# Patient Record
Sex: Male | Born: 1965 | Race: White | Hispanic: No | Smoking: Former smoker
Health system: Southern US, Community
[De-identification: ages and names within clinical notes are randomized; demographics above are authoritative.]

## PROBLEM LIST (undated history)

## (undated) DIAGNOSIS — N200 Calculus of kidney: Secondary | ICD-10-CM

## (undated) DIAGNOSIS — K5900 Constipation, unspecified: Secondary | ICD-10-CM

## (undated) DIAGNOSIS — R109 Unspecified abdominal pain: Secondary | ICD-10-CM

## (undated) DIAGNOSIS — R7989 Other specified abnormal findings of blood chemistry: Secondary | ICD-10-CM

## (undated) DIAGNOSIS — K219 Gastro-esophageal reflux disease without esophagitis: Secondary | ICD-10-CM

## (undated) DIAGNOSIS — E785 Hyperlipidemia, unspecified: Secondary | ICD-10-CM

## (undated) DIAGNOSIS — N529 Male erectile dysfunction, unspecified: Secondary | ICD-10-CM

## (undated) DIAGNOSIS — F339 Major depressive disorder, recurrent, unspecified: Secondary | ICD-10-CM

## (undated) DIAGNOSIS — F909 Attention-deficit hyperactivity disorder, unspecified type: Secondary | ICD-10-CM

## (undated) DIAGNOSIS — F901 Attention-deficit hyperactivity disorder, predominantly hyperactive type: Secondary | ICD-10-CM

## (undated) DIAGNOSIS — F419 Anxiety disorder, unspecified: Secondary | ICD-10-CM

## (undated) HISTORY — PX: OTHER SURGICAL HISTORY: SHX169

## (undated) HISTORY — DX: Anxiety disorder, unspecified: F41.9

## (undated) HISTORY — DX: Attention-deficit hyperactivity disorder, unspecified type: F90.9

## (undated) HISTORY — DX: Calculus of kidney: N20.0

## (undated) HISTORY — DX: Attention-deficit hyperactivity disorder, predominantly hyperactive type: F90.1

## (undated) HISTORY — DX: Constipation, unspecified: K59.00

## (undated) HISTORY — PX: LITHOTRIPSY: SUR834

## (undated) HISTORY — DX: Unspecified abdominal pain: R10.9

## (undated) HISTORY — DX: Other specified abnormal findings of blood chemistry: R79.89

## (undated) HISTORY — DX: Gastro-esophageal reflux disease without esophagitis: K21.9

## (undated) HISTORY — DX: Major depressive disorder, recurrent, unspecified: F33.9

## (undated) HISTORY — DX: Hyperlipidemia, unspecified: E78.5

## (undated) HISTORY — DX: Male erectile dysfunction, unspecified: N52.9

---

## 2006-05-22 ENCOUNTER — Emergency Department (HOSPITAL_COMMUNITY): Admission: EM | Admit: 2006-05-22 | Discharge: 2006-05-22 | Payer: Self-pay | Admitting: Family Medicine

## 2008-06-05 HISTORY — PX: SHOULDER SURGERY: SHX246

## 2008-07-06 ENCOUNTER — Encounter: Admission: RE | Admit: 2008-07-06 | Discharge: 2008-07-06 | Payer: Self-pay | Admitting: Family Medicine

## 2008-07-21 ENCOUNTER — Encounter: Admission: RE | Admit: 2008-07-21 | Discharge: 2008-07-21 | Payer: Self-pay | Admitting: Family Medicine

## 2008-11-18 ENCOUNTER — Encounter: Admission: RE | Admit: 2008-11-18 | Discharge: 2008-11-18 | Payer: Self-pay | Admitting: Family Medicine

## 2009-06-05 HISTORY — PX: SHOULDER SURGERY: SHX246

## 2009-08-30 ENCOUNTER — Encounter: Admission: RE | Admit: 2009-08-30 | Discharge: 2009-08-30 | Payer: Self-pay | Admitting: Family Medicine

## 2009-09-13 ENCOUNTER — Ambulatory Visit (HOSPITAL_COMMUNITY): Admission: RE | Admit: 2009-09-13 | Discharge: 2009-09-13 | Payer: Self-pay | Admitting: Urology

## 2010-07-01 IMAGING — CT CT ABD-PELV W/O CM
2 of 4 series · 17 of 46 positions shown, 19 images · non-contrast
Comparison: 07/21/2008

CLINICAL DATA: Left flank pain, hematuria.

CT ABDOMEN AND PELVIS WITHOUT CONTRAST
TECHNIQUE: Multidetector CT imaging of the abdomen and pelvis was
performed following the standard protocol without intravenous
contrast.

[Series 2: wo · axial · 0.75mm/px · z∈[+70,+495]mm · 14 of 93 slices shown, 16 images]
[im 4/93  soft-tissue]
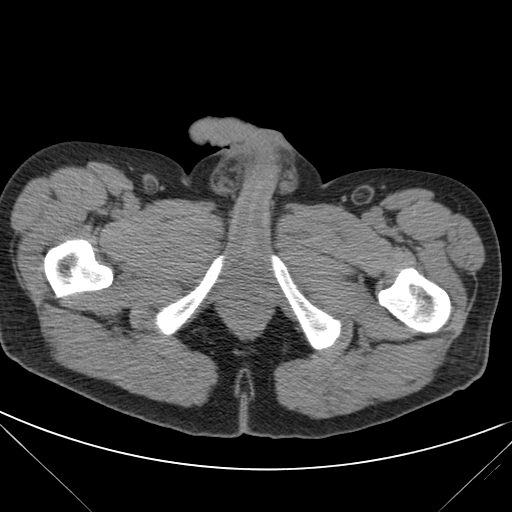
[im 4/93  bone]
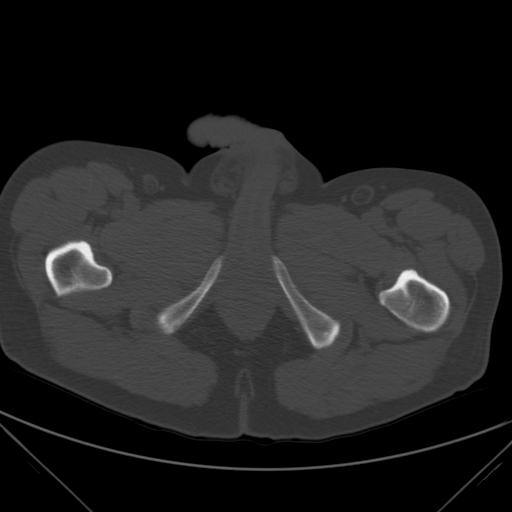
[im 12/93  soft-tissue]
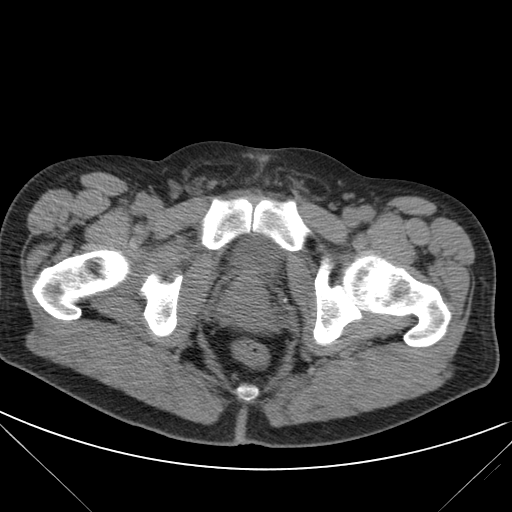
[im 20/93  soft-tissue]
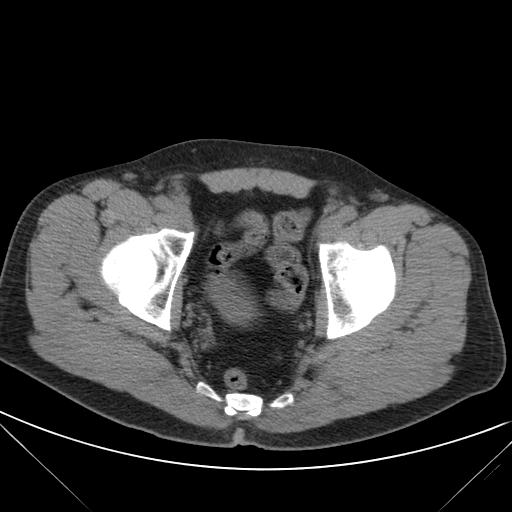
[im 24/93  soft-tissue]
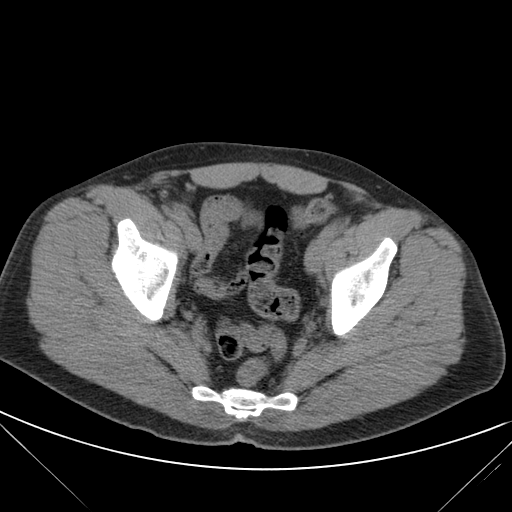
[im 31/93  soft-tissue]
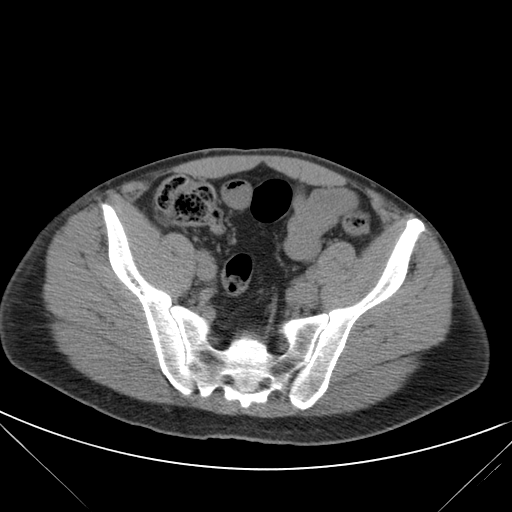
[im 39/93  soft-tissue]
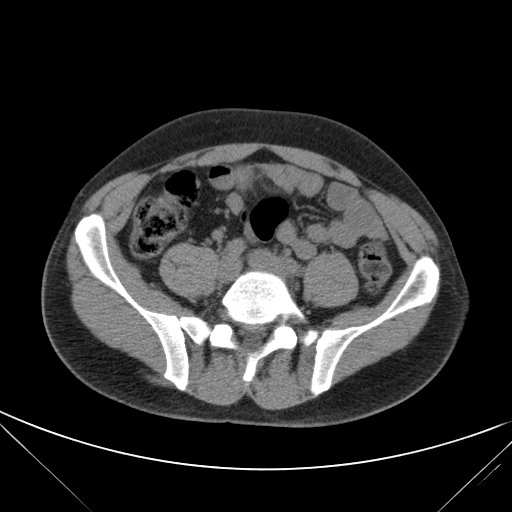
[im 43/93  soft-tissue]
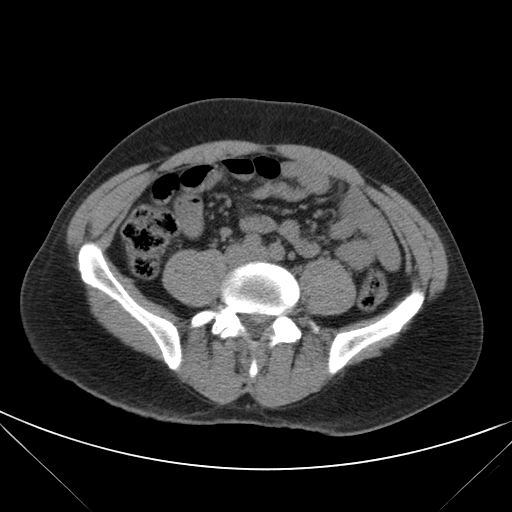
[im 50/93  soft-tissue]
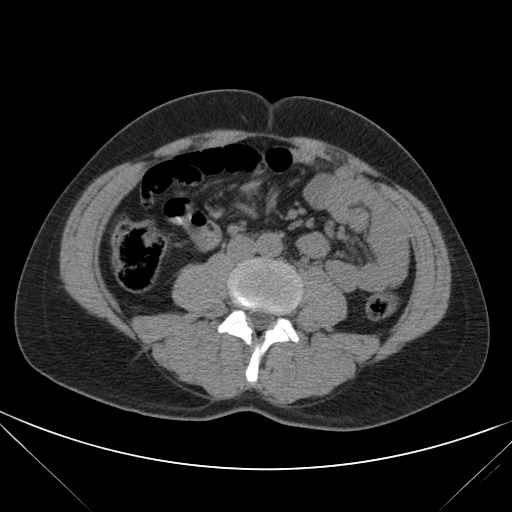
[im 54/93  soft-tissue]
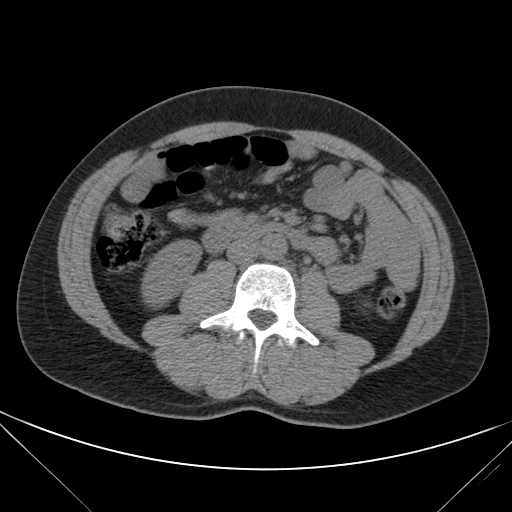
[im 54/93  bone]
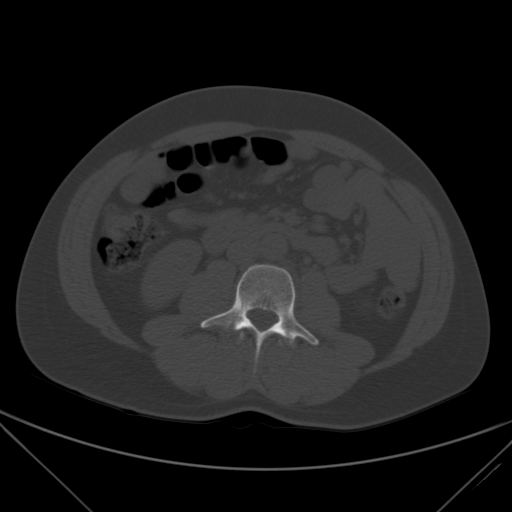
[im 62/93  soft-tissue]
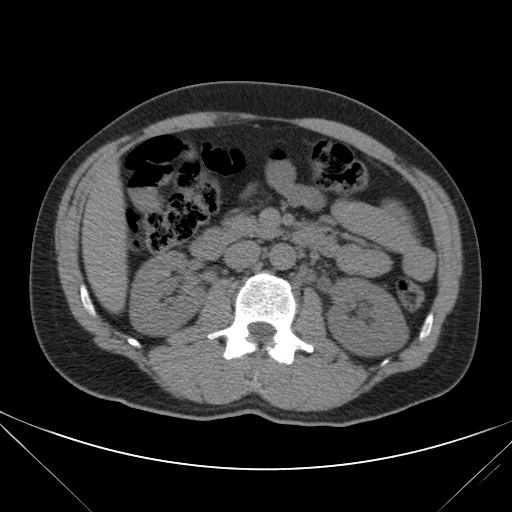
[im 70/93  soft-tissue]
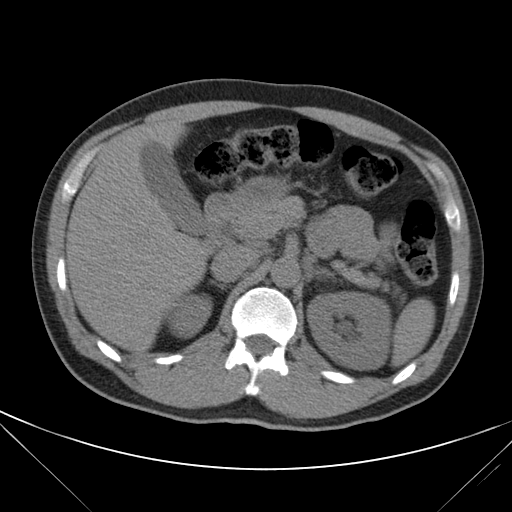
[im 73/93  soft-tissue]
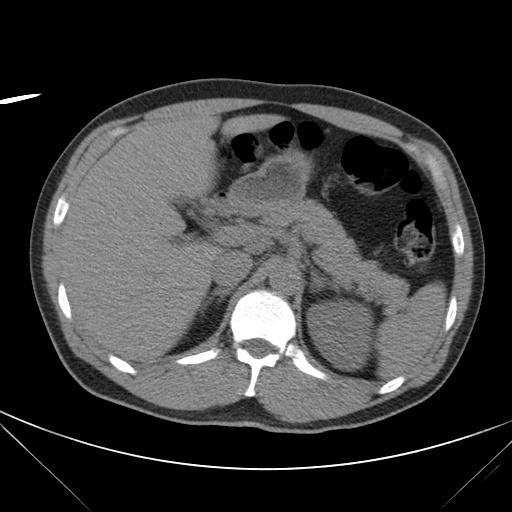
[im 81/93  soft-tissue]
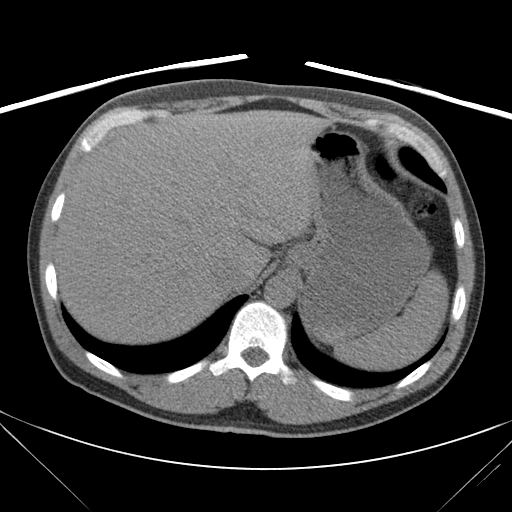
[im 89/93  soft-tissue]
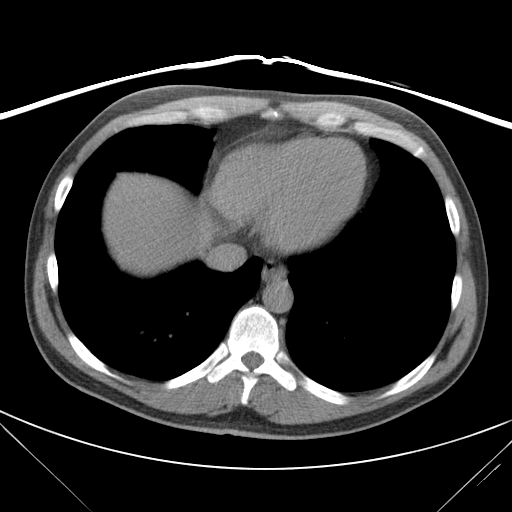

[cor · coronal · 0.90mm/px · 3 of 79 slices shown]
[im 27/79  soft-tissue]
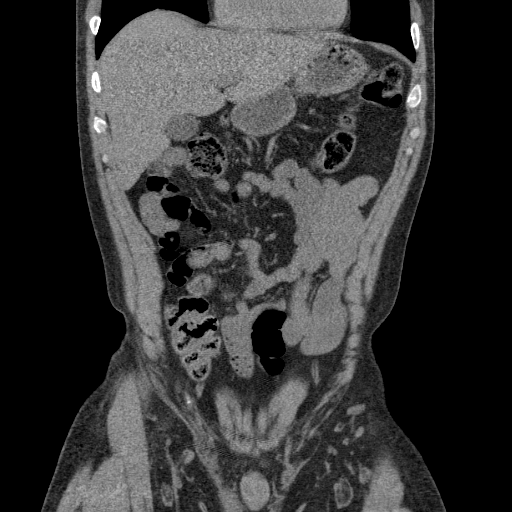
[im 35/79  soft-tissue]
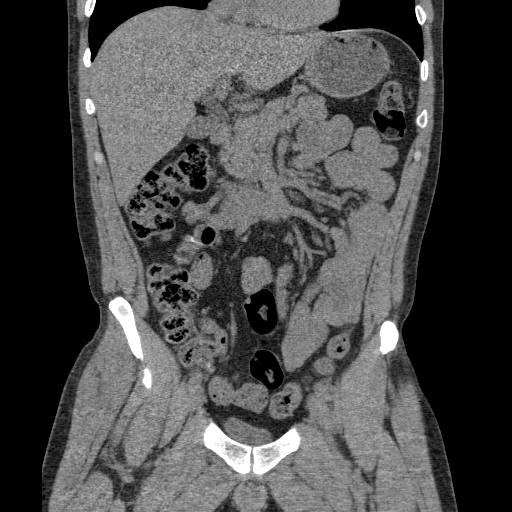
[im 44/79  soft-tissue]
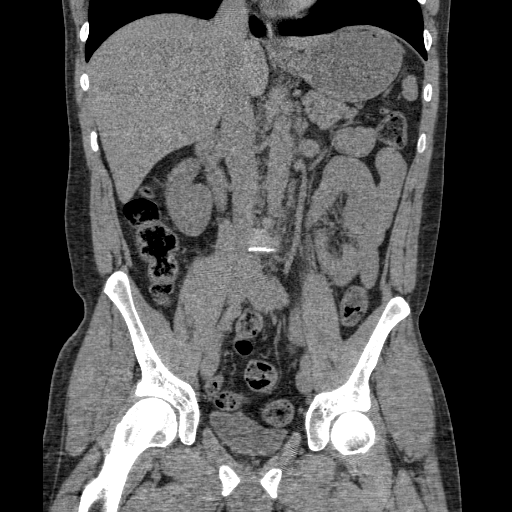

[17 of 46 positions shown; findings below may reference images not displayed]

FINDINGS: Lung bases are clear.  No effusions.  Heart is normal
size.

Abdomen:  6 mm left UPJ stone with mild left hydronephrosis.  Small
nonobstructing lower pole stone on the left.  No stones in the
right kidney or ureter.  No hydronephrosis on the right.

Liver, spleen, gallbladder, pancreas, adrenals have an unremarkable
unenhanced appearance.  Scattered descending colonic diverticula
without diverticulosis.  Small bowel unremarkable.  Aorta normal
caliber.

Pelvis:  No additional ureteral stones.  Bladder unremarkable.
Prostate and seminal vesicles grossly unremarkable.  Pelvic large
and small bowel normal.  Appendix is visualized and is normal.

Small bilateral inguinal hernias containing fat.

No acute bony abnormality.
IMPRESSION: 6 mm left UPJ stone with mild left hydronephrosis.  Small
nonobstructing left lower pole stone.

## 2011-06-08 ENCOUNTER — Ambulatory Visit: Payer: Worker's Compensation

## 2013-09-01 ENCOUNTER — Ambulatory Visit (INDEPENDENT_AMBULATORY_CARE_PROVIDER_SITE_OTHER): Payer: BC Managed Care – PPO | Admitting: Family Medicine

## 2013-09-01 VITALS — BP 136/98 | HR 74 | Temp 97.9°F | Resp 18 | Ht 70.0 in | Wt 203.2 lb

## 2013-09-01 DIAGNOSIS — H5710 Ocular pain, unspecified eye: Secondary | ICD-10-CM

## 2013-09-01 DIAGNOSIS — S058X9A Other injuries of unspecified eye and orbit, initial encounter: Secondary | ICD-10-CM

## 2013-09-01 DIAGNOSIS — H0289 Other specified disorders of eyelid: Secondary | ICD-10-CM

## 2013-09-01 DIAGNOSIS — S01102A Unspecified open wound of left eyelid and periocular area, initial encounter: Secondary | ICD-10-CM

## 2013-09-01 NOTE — Patient Instructions (Signed)
WOUND CARE Please return in 5 days to have your stitches/staples removed or sooner if you have concerns. Marland Kitchen. Keep area clean and dry for 24 hours. Do not remove bandage, if applied. . Continue daily cleansing with soap and water until stitches/staples are removed. . Do not apply any ointments or creams to the wound while stitches/staples are in place, as this may cause delayed healing. . Notify the office if you experience any of the following signs of infection: Swelling, redness, pus drainage, streaking, fever >101.0 F . Notify the office if you experience excessive bleeding that does not stop after 15-20 minutes of constant, firm pressure.

## 2013-09-01 NOTE — Progress Notes (Signed)
   Patient ID: Mindi JunkerRobert J Lewman MRN: [id number], DOB: 03/02/1966, 48 y.o. Date of Encounter: 09/01/2013, 9:36 PM   PROCEDURE NOTE: Verbal consent obtained.  Risks and benefits of the procedure were explained. Patient made an informed decision to proceed with the procedure. Sterile technique employed. Numbing: Anesthesia obtained with 1% plain lidocaine 1 cc for local anesthesia.   Cleansed with soap and water. Irrigated. Betadine prep per usual protocol.  Wound explored, no deep structures involved, no foreign bodies.   Wound repaired with # 5 simple interrupted sutures of 6-0 Ethilon.  Hemostasis obtained. Wound cleansed and dressed.  Wound care instructions including precautions covered with patient. Handout given.  Anticipate suture removal in 5 days.   Signed, Eula Listenyan Crystalann Korf, MHS, PA-C [facility] [place], [place] [id number] [phone] [facility] 09/01/2013 9:36 PM

## 2013-09-01 NOTE — Progress Notes (Signed)
Subjective: 48 year old man who was scratched on his left upper eyelid by his laboratory puppy. He had been playing with the dog and is wife push the dog away rather than having it landed on her since she is postop from her gallbladder. The dog cough hit him squarely in the left orbit, and he did not see it coming. He reached up and saw blood and came on in. It did not knock his contact out, and his vision is good.  TDap last year.  Objective:  vsual acuit good in both eyes as noted.  He has a 2.8 cm laceration along the top of the left upper eyelid perfectly along the crease line. The wound has about 3 mm of gaping, primarily in the center portion.  Assessment: Laceration left upper eyelid  Plan: Eula Listenyan Dunn, PA-C will suture.

## 2013-09-06 ENCOUNTER — Ambulatory Visit (INDEPENDENT_AMBULATORY_CARE_PROVIDER_SITE_OTHER): Payer: BC Managed Care – PPO | Admitting: Family Medicine

## 2013-09-06 VITALS — BP 120/74 | HR 85 | Temp 97.9°F | Resp 18 | Wt 204.0 lb

## 2013-09-06 DIAGNOSIS — S01109A Unspecified open wound of unspecified eyelid and periocular area, initial encounter: Secondary | ICD-10-CM

## 2013-09-06 DIAGNOSIS — Z4802 Encounter for removal of sutures: Secondary | ICD-10-CM

## 2013-09-06 DIAGNOSIS — S058X9A Other injuries of unspecified eye and orbit, initial encounter: Secondary | ICD-10-CM

## 2013-09-06 NOTE — Progress Notes (Signed)
Subjective: Patient here for sutures to be removed from his upper lid. It is doing well.  Objective: He has a moderate block on the left I. Sutures were removed little difficulty because they were so small, but the wound looks fine.  Assessment: Wound left upper eyelid  Plan: Return when necessary

## 2018-03-18 DIAGNOSIS — Z23 Encounter for immunization: Secondary | ICD-10-CM | POA: Diagnosis not present

## 2018-07-11 DIAGNOSIS — F419 Anxiety disorder, unspecified: Secondary | ICD-10-CM | POA: Diagnosis not present

## 2018-07-11 DIAGNOSIS — R000 Tachycardia, unspecified: Secondary | ICD-10-CM | POA: Diagnosis not present

## 2018-07-11 DIAGNOSIS — F901 Attention-deficit hyperactivity disorder, predominantly hyperactive type: Secondary | ICD-10-CM | POA: Diagnosis not present

## 2018-07-11 DIAGNOSIS — Z0000 Encounter for general adult medical examination without abnormal findings: Secondary | ICD-10-CM | POA: Diagnosis not present

## 2018-07-11 DIAGNOSIS — E785 Hyperlipidemia, unspecified: Secondary | ICD-10-CM | POA: Diagnosis not present

## 2018-07-16 DIAGNOSIS — R7309 Other abnormal glucose: Secondary | ICD-10-CM | POA: Diagnosis not present

## 2018-07-16 DIAGNOSIS — E785 Hyperlipidemia, unspecified: Secondary | ICD-10-CM | POA: Diagnosis not present

## 2018-07-16 DIAGNOSIS — R000 Tachycardia, unspecified: Secondary | ICD-10-CM | POA: Diagnosis not present

## 2018-07-16 DIAGNOSIS — Z125 Encounter for screening for malignant neoplasm of prostate: Secondary | ICD-10-CM | POA: Diagnosis not present

## 2018-07-23 NOTE — Progress Notes (Signed)
Cardiology Office Note   Date:  07/25/2018   ID:  Ethan Baird, Ethan Baird Jan 16, 1966, MRN [id number]  PCP:  Cari Caraway, MD  Cardiologist:   No primary care provider on file. Referring:  Cari Caraway, MD  Chief Complaint  Patient presents with  . Chest Pain      History of Present Illness: Ethan Baird is a 53 y.o. male who is referred by Cari Caraway, MD for evaluation of tachycardia and leg pain.  The patient has no past cardiac history.  Since December or so he has been having leg pain.  He has had some problems with his legs for some time with restless leg syndrome treated 10 years ago.  He does a lot of walking.  However, over the last 6 months he thinks he has had more problems with his legs with some discomfort at the end of the day.  He is not describing discomfort when he walks.  However, he gets thigh fatigue when he walks.  He says that since December this is been particularly severe.  He has had nights where he comes home and his legs are so fatigued that they actually ache.  His girlfriend says they are sore to touch.  While this is happening he might have his heart racing.  He has to let this slowly resolve over 2 to 3 hours.  He is not describing back discomfort.  He is not describing joint problems.  He is not have difficulty walking which again he has to do quite a bit.  He does describe chest discomfort.  He describes bilateral discomfort that is slightly axillary.  Seems to help to stretch.  He does not bring this on with activities.  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  He has pushed a lawnmower without bringing this on.  He is not describing any new shortness of breath, PND or orthopnea.  He said no new presyncope or syncope.   Past Medical History:  Diagnosis Date  . Abdominal pain   . ADHD   . Anxiety   . Attention-deficit disorder, predominantly hyperactive-impulsive type   . Constipation   . Dyslipidemia   . Erectile  dysfunction   . Gastroesophageal reflux disease without esophagitis   . Kidney stone   . Low testosterone in male   . Major depression, recurrent ([place])     Past Surgical History:  Procedure Laterality Date  . GRAPEY    . LITHOTRIPSY    . SHOULDER SURGERY Left 2010  . SHOULDER SURGERY Right 2011     Current Outpatient Medications  Medication Sig Dispense Refill  . ADDERALL XR 30 MG 24 hr capsule Take 30 mg by mouth daily.    . magnesium gluconate (MAGONATE) 500 MG tablet Take 500 mg by mouth daily.    Marland Kitchen omeprazole (PRILOSEC) 20 MG capsule Take 20 mg by mouth daily.    . sildenafil (REVATIO) 20 MG tablet Take 20 mg by mouth daily. TAKE 2-5 TABLETS ONCE DAILY AS DIRECTED     No current facility-administered medications for this visit.     Allergies:   Patient has no known allergies.    Social History:  The patient  reports that he has quit smoking. He has never used smokeless tobacco. He reports current alcohol use. He reports that he does not use drugs.   Family History:  The patient's family history includes CAD in his father; CVA in his father;  Gallbladder disease in his mother; Heart attack in his father; Hyperlipidemia in his mother; Multiple sclerosis in his mother.  He does not know his father's family history and is not sure whether he still alive.  He knows of him but is not in contact with him.   ROS:  Please see the history of present illness.   Otherwise, review of systems are positive for none.   All other systems are reviewed and negative.    PHYSICAL EXAM: VS:  BP 126/90 (BP Location: Left Arm)   Pulse 76   Ht '6\' 1"'$  (1.854 m)   Wt 200 lb 9.6 oz (91 kg)   SpO2 96%   BMI 26.47 kg/m  , BMI Body mass index is 26.47 kg/m. GENERAL:  Well appearing HEENT:  Pupils equal round and reactive, fundi not visualized, oral mucosa unremarkable NECK:  No jugular venous distention, waveform within normal limits, carotid upstroke brisk and symmetric, no bruits, no  thyromegaly LYMPHATICS:  No cervical, inguinal adenopathy LUNGS:  Clear to auscultation bilaterally BACK:  No CVA tenderness CHEST:  Unremarkable HEART:  PMI not displaced or sustained,S1 and S2 within normal limits, no S3, no S4, no clicks, no rubs, no murmurs ABD:  Flat, positive bowel sounds normal in frequency in pitch, no bruits, no rebound, no guarding, no midline pulsatile mass, no hepatomegaly, no splenomegaly EXT:  2 plus pulses throughout, no edema, no cyanosis no clubbing SKIN:  No rashes no nodules NEURO:  Cranial nerves II through XII grossly intact, motor grossly intact throughout PSYCH:  Cognitively intact, oriented to person place and time    EKG:  EKG is ordered today. The ekg ordered today demonstrates sinus rhythm, rate 76, axis within normal limits, intervals within normal limits, no acute ST-T wave changes.   Recent Labs: No results found for requested labs within last 8760 hours.    Lipid Panel No results found for: CHOL, TRIG, HDL, CHOLHDL, VLDL, LDLCALC, LDLDIRECT    Wt Readings from Last 3 Encounters:  07/25/18 200 lb 9.6 oz (91 kg)  09/06/13 204 lb (92.5 kg)  09/01/13 203 lb 3.2 oz (92.2 kg)      Other studies Reviewed: Additional studies/ records that were reviewed today include: Labs, office records. Review of the above records demonstrates:  Please see elsewhere in the note.     ASSESSMENT AND PLAN:  TACHYCARDIA: The tachycardia seems to be more associated with his severe leg pain.  I am not anticipating that we will need an event monitor.  I will evaluate this as below.  I also discussed possibly getting an Alive Cor to record his rapid heart rates.  Of note I did review blood work.  TSH was recently normal.  Electrolytes were normal.  LEG PAIN: He has excellent peripheral pulses.  I do not suspect this to be a vascular problem.  I think there is a small chance of this perhaps being inflammatory or myositis.  I will check an ESR and CK.  If  these are normal I might suggest first neurosurgical/orthopedist for evaluation of possible etiology related to lumbar spine.  Alternatively he could see a neurologist.  We discussed these possibilities  I will be assessing him as below as well.  CHEST PAIN: His chest pain is somewhat atypical.  I am going to bring him back for a POET (Plain Old Exercise Treadmill).  This will also allow me to assess whether he gets any leg discomfort or symptoms on the treadmill.   Current  medicines are reviewed at length with the patient today.  The patient does not have concerns regarding medicines.  The following changes have been made:  no change  Labs/ tests ordered today include: Follow-up  Orders Placed This Encounter  Procedures  . CK (Creatine Kinase)  . Sed Rate (ESR)  . EXERCISE TOLERANCE TEST (ETT)     Disposition:   FU with me as needed.      Signed, Minus Breeding, MD  07/25/2018 12:56 PM    [place]

## 2018-07-25 ENCOUNTER — Ambulatory Visit: Payer: BLUE CROSS/BLUE SHIELD | Admitting: Cardiology

## 2018-07-25 ENCOUNTER — Encounter: Payer: Self-pay | Admitting: Cardiology

## 2018-07-25 VITALS — BP 126/90 | HR 76 | Ht 73.0 in | Wt 200.6 lb

## 2018-07-25 DIAGNOSIS — M791 Myalgia, unspecified site: Secondary | ICD-10-CM | POA: Diagnosis not present

## 2018-07-25 DIAGNOSIS — R079 Chest pain, unspecified: Secondary | ICD-10-CM | POA: Diagnosis not present

## 2018-07-25 DIAGNOSIS — R000 Tachycardia, unspecified: Secondary | ICD-10-CM | POA: Insufficient documentation

## 2018-07-25 NOTE — Patient Instructions (Signed)
Medication Instructions:  Continue current medications  If you need a refill on your cardiac medications before your next appointment, please call your pharmacy.  Labwork: Sed Rate, CK HERE IN OUR OFFICE AT [facility]  You will NOT need to fast   Take the provided lab slips with you to the lab for your blood draw.   When you have your labs (blood work) drawn today and your tests are completely normal, you will receive your results only by MyChart Message (if you have MyChart) -OR-  A paper copy in the mail.  If you have any lab test that is abnormal or we need to change your treatment, we will call you to review these results.  Testing/Procedures: Your physician has requested that you have an exercise tolerance test. For further information please visit [email or web address] Please also follow instruction sheet, as given.  Special Instructions: ALIVECOR  Follow-Up: . Your physician recommends that you schedule a follow-up appointment in: As Needed   At [facility], you and your health needs are our priority.  As part of our continuing mission to provide you with exceptional heart care, we have created designated Provider Care Teams.  These Care Teams include your primary Cardiologist (physician) and Advanced Practice Providers (APPs -  Physician Assistants and Nurse Practitioners) who all work together to provide you with the care you need, when you need it.  Thank you for choosing [facility]!!

## 2018-07-26 ENCOUNTER — Telehealth: Payer: Self-pay | Admitting: Cardiology

## 2018-07-26 LAB — CK: Total CK: 111 U/L (ref 24–204)

## 2018-07-26 LAB — SEDIMENTATION RATE: SED RATE: 2 mm/h (ref 0–30)

## 2018-07-26 NOTE — Telephone Encounter (Signed)
See below . Thanks

## 2018-07-26 NOTE — Telephone Encounter (Signed)
Patient returning call from [place] regarding results

## 2018-07-29 ENCOUNTER — Telehealth (INDEPENDENT_AMBULATORY_CARE_PROVIDER_SITE_OTHER): Payer: BLUE CROSS/BLUE SHIELD

## 2018-07-29 DIAGNOSIS — R079 Chest pain, unspecified: Secondary | ICD-10-CM | POA: Diagnosis not present

## 2018-07-29 DIAGNOSIS — B349 Viral infection, unspecified: Secondary | ICD-10-CM | POA: Diagnosis not present

## 2018-07-29 NOTE — Telephone Encounter (Signed)
ekg order placed.

## 2018-08-02 NOTE — Telephone Encounter (Signed)
Pt aware of his blood work  

## 2018-08-14 ENCOUNTER — Telehealth (HOSPITAL_COMMUNITY): Payer: Self-pay

## 2018-08-14 NOTE — Telephone Encounter (Signed)
Encounter complete. 

## 2018-08-16 ENCOUNTER — Telehealth (HOSPITAL_COMMUNITY): Payer: Self-pay

## 2018-08-16 NOTE — Telephone Encounter (Signed)
Encounter complete. 

## 2018-08-20 ENCOUNTER — Ambulatory Visit (HOSPITAL_COMMUNITY)
Admission: RE | Admit: 2018-08-20 | Discharge: 2018-08-20 | Disposition: A | Discharge status: Home / Self Care | Payer: BLUE CROSS/BLUE SHIELD | Source: Ambulatory Visit | Attending: Cardiology | Admitting: Cardiology

## 2018-08-20 ENCOUNTER — Other Ambulatory Visit: Payer: Self-pay

## 2018-08-20 DIAGNOSIS — R000 Tachycardia, unspecified: Secondary | ICD-10-CM | POA: Diagnosis not present

## 2018-08-21 LAB — EXERCISE TOLERANCE TEST
CHL RATE OF PERCEIVED EXERTION: 17
CSEPED: 12 min
CSEPEDS: 0 s
CSEPEW: 13.7 METS
CSEPHR: 92 %
MPHR: 170 {beats}/min
Peak HR: 157 {beats}/min
Rest HR: 82 {beats}/min

## 2018-08-22 DIAGNOSIS — Z3009 Encounter for other general counseling and advice on contraception: Secondary | ICD-10-CM | POA: Diagnosis not present

## 2018-09-27 DIAGNOSIS — Z302 Encounter for sterilization: Secondary | ICD-10-CM | POA: Diagnosis not present

## 2019-01-14 DIAGNOSIS — F901 Attention-deficit hyperactivity disorder, predominantly hyperactive type: Secondary | ICD-10-CM | POA: Diagnosis not present

## 2019-01-14 DIAGNOSIS — K219 Gastro-esophageal reflux disease without esophagitis: Secondary | ICD-10-CM | POA: Diagnosis not present

## 2019-01-14 DIAGNOSIS — K5900 Constipation, unspecified: Secondary | ICD-10-CM | POA: Diagnosis not present

## 2019-01-14 DIAGNOSIS — N529 Male erectile dysfunction, unspecified: Secondary | ICD-10-CM | POA: Diagnosis not present

## 2019-02-07 DIAGNOSIS — K5730 Diverticulosis of large intestine without perforation or abscess without bleeding: Secondary | ICD-10-CM | POA: Diagnosis not present

## 2019-02-07 DIAGNOSIS — K640 First degree hemorrhoids: Secondary | ICD-10-CM | POA: Diagnosis not present

## 2019-02-07 DIAGNOSIS — Z1211 Encounter for screening for malignant neoplasm of colon: Secondary | ICD-10-CM | POA: Diagnosis not present

## 2019-03-20 DIAGNOSIS — Z23 Encounter for immunization: Secondary | ICD-10-CM | POA: Diagnosis not present

## 2019-08-05 DIAGNOSIS — F901 Attention-deficit hyperactivity disorder, predominantly hyperactive type: Secondary | ICD-10-CM | POA: Diagnosis not present

## 2019-08-05 DIAGNOSIS — Z125 Encounter for screening for malignant neoplasm of prostate: Secondary | ICD-10-CM | POA: Diagnosis not present

## 2019-08-05 DIAGNOSIS — Z79899 Other long term (current) drug therapy: Secondary | ICD-10-CM | POA: Diagnosis not present

## 2019-08-05 DIAGNOSIS — R7309 Other abnormal glucose: Secondary | ICD-10-CM | POA: Diagnosis not present

## 2019-08-05 DIAGNOSIS — K219 Gastro-esophageal reflux disease without esophagitis: Secondary | ICD-10-CM | POA: Diagnosis not present

## 2019-08-05 DIAGNOSIS — Z0000 Encounter for general adult medical examination without abnormal findings: Secondary | ICD-10-CM | POA: Diagnosis not present

## 2019-11-15 DIAGNOSIS — L03116 Cellulitis of left lower limb: Secondary | ICD-10-CM | POA: Diagnosis not present

## 2019-11-15 DIAGNOSIS — L02612 Cutaneous abscess of left foot: Secondary | ICD-10-CM | POA: Diagnosis not present

## 2019-11-15 DIAGNOSIS — L03032 Cellulitis of left toe: Secondary | ICD-10-CM | POA: Diagnosis not present

## 2019-11-15 DIAGNOSIS — L0291 Cutaneous abscess, unspecified: Secondary | ICD-10-CM | POA: Diagnosis not present

## 2019-11-15 DIAGNOSIS — R21 Rash and other nonspecific skin eruption: Secondary | ICD-10-CM | POA: Diagnosis not present

## 2019-11-17 DIAGNOSIS — L02612 Cutaneous abscess of left foot: Secondary | ICD-10-CM | POA: Diagnosis not present

## 2019-11-17 DIAGNOSIS — L03032 Cellulitis of left toe: Secondary | ICD-10-CM | POA: Diagnosis not present

## 2020-02-10 DIAGNOSIS — K219 Gastro-esophageal reflux disease without esophagitis: Secondary | ICD-10-CM | POA: Diagnosis not present

## 2020-02-10 DIAGNOSIS — R7309 Other abnormal glucose: Secondary | ICD-10-CM | POA: Diagnosis not present

## 2020-02-10 DIAGNOSIS — F901 Attention-deficit hyperactivity disorder, predominantly hyperactive type: Secondary | ICD-10-CM | POA: Diagnosis not present

## 2020-08-10 DIAGNOSIS — Z23 Encounter for immunization: Secondary | ICD-10-CM | POA: Diagnosis not present

## 2020-08-10 DIAGNOSIS — Z125 Encounter for screening for malignant neoplasm of prostate: Secondary | ICD-10-CM | POA: Diagnosis not present

## 2020-08-10 DIAGNOSIS — E538 Deficiency of other specified B group vitamins: Secondary | ICD-10-CM | POA: Diagnosis not present

## 2020-08-10 DIAGNOSIS — K219 Gastro-esophageal reflux disease without esophagitis: Secondary | ICD-10-CM | POA: Diagnosis not present

## 2020-08-10 DIAGNOSIS — N529 Male erectile dysfunction, unspecified: Secondary | ICD-10-CM | POA: Diagnosis not present

## 2020-08-10 DIAGNOSIS — R7309 Other abnormal glucose: Secondary | ICD-10-CM | POA: Diagnosis not present

## 2020-08-10 DIAGNOSIS — F901 Attention-deficit hyperactivity disorder, predominantly hyperactive type: Secondary | ICD-10-CM | POA: Diagnosis not present

## 2020-08-10 DIAGNOSIS — E559 Vitamin D deficiency, unspecified: Secondary | ICD-10-CM | POA: Diagnosis not present

## 2020-08-10 DIAGNOSIS — Z0000 Encounter for general adult medical examination without abnormal findings: Secondary | ICD-10-CM | POA: Diagnosis not present

## 2020-08-10 DIAGNOSIS — R0683 Snoring: Secondary | ICD-10-CM | POA: Diagnosis not present

## 2020-09-09 DIAGNOSIS — G4719 Other hypersomnia: Secondary | ICD-10-CM | POA: Diagnosis not present

## 2020-09-09 DIAGNOSIS — F901 Attention-deficit hyperactivity disorder, predominantly hyperactive type: Secondary | ICD-10-CM | POA: Diagnosis not present

## 2020-09-09 DIAGNOSIS — F5112 Insufficient sleep syndrome: Secondary | ICD-10-CM | POA: Diagnosis not present

## 2020-09-16 DIAGNOSIS — K219 Gastro-esophageal reflux disease without esophagitis: Secondary | ICD-10-CM | POA: Diagnosis not present

## 2020-09-16 DIAGNOSIS — G4733 Obstructive sleep apnea (adult) (pediatric): Secondary | ICD-10-CM | POA: Diagnosis not present

## 2020-09-16 DIAGNOSIS — F901 Attention-deficit hyperactivity disorder, predominantly hyperactive type: Secondary | ICD-10-CM | POA: Diagnosis not present

## 2020-09-21 DIAGNOSIS — G4733 Obstructive sleep apnea (adult) (pediatric): Secondary | ICD-10-CM | POA: Diagnosis not present

## 2020-10-06 DIAGNOSIS — G4733 Obstructive sleep apnea (adult) (pediatric): Secondary | ICD-10-CM | POA: Diagnosis not present

## 2020-10-06 DIAGNOSIS — G4719 Other hypersomnia: Secondary | ICD-10-CM | POA: Diagnosis not present

## 2020-11-06 DIAGNOSIS — G4719 Other hypersomnia: Secondary | ICD-10-CM | POA: Diagnosis not present

## 2020-11-06 DIAGNOSIS — G4733 Obstructive sleep apnea (adult) (pediatric): Secondary | ICD-10-CM | POA: Diagnosis not present

## 2020-12-06 DIAGNOSIS — G4719 Other hypersomnia: Secondary | ICD-10-CM | POA: Diagnosis not present

## 2020-12-06 DIAGNOSIS — G4733 Obstructive sleep apnea (adult) (pediatric): Secondary | ICD-10-CM | POA: Diagnosis not present

## 2020-12-20 DIAGNOSIS — F901 Attention-deficit hyperactivity disorder, predominantly hyperactive type: Secondary | ICD-10-CM | POA: Diagnosis not present

## 2020-12-20 DIAGNOSIS — F5112 Insufficient sleep syndrome: Secondary | ICD-10-CM | POA: Diagnosis not present

## 2020-12-20 DIAGNOSIS — G4733 Obstructive sleep apnea (adult) (pediatric): Secondary | ICD-10-CM | POA: Diagnosis not present

## 2021-01-06 DIAGNOSIS — G4719 Other hypersomnia: Secondary | ICD-10-CM | POA: Diagnosis not present

## 2021-01-06 DIAGNOSIS — G4733 Obstructive sleep apnea (adult) (pediatric): Secondary | ICD-10-CM | POA: Diagnosis not present

## 2021-02-15 DIAGNOSIS — Z23 Encounter for immunization: Secondary | ICD-10-CM | POA: Diagnosis not present

## 2021-02-15 DIAGNOSIS — N529 Male erectile dysfunction, unspecified: Secondary | ICD-10-CM | POA: Diagnosis not present

## 2021-02-15 DIAGNOSIS — F901 Attention-deficit hyperactivity disorder, predominantly hyperactive type: Secondary | ICD-10-CM | POA: Diagnosis not present

## 2021-02-15 DIAGNOSIS — G4733 Obstructive sleep apnea (adult) (pediatric): Secondary | ICD-10-CM | POA: Diagnosis not present

## 2021-02-15 DIAGNOSIS — K219 Gastro-esophageal reflux disease without esophagitis: Secondary | ICD-10-CM | POA: Diagnosis not present

## 2021-10-17 ENCOUNTER — Other Ambulatory Visit: Payer: Self-pay | Admitting: Family Medicine

## 2021-10-17 DIAGNOSIS — R079 Chest pain, unspecified: Secondary | ICD-10-CM

## 2021-11-29 ENCOUNTER — Ambulatory Visit
Admission: RE | Admit: 2021-11-29 | Discharge: 2021-11-29 | Disposition: A | Discharge status: Home / Self Care | Payer: No Typology Code available for payment source | Source: Ambulatory Visit | Attending: Family Medicine | Admitting: Family Medicine

## 2021-11-29 DIAGNOSIS — R079 Chest pain, unspecified: Secondary | ICD-10-CM
